# Patient Record
Sex: Female | Born: 1970 | Hispanic: Yes | Marital: Married | Smoking: Never smoker
Health system: Southern US, Community
[De-identification: ages and names within clinical notes are randomized; demographics above are authoritative.]

## PROBLEM LIST (undated history)

## (undated) DIAGNOSIS — Z8719 Personal history of other diseases of the digestive system: Secondary | ICD-10-CM

## (undated) HISTORY — DX: Personal history of other diseases of the digestive system: Z87.19

---

## 2007-09-15 HISTORY — PX: MYOMECTOMY: SHX85

## 2008-09-14 HISTORY — PX: CHOLECYSTECTOMY: SHX55

## 2011-10-23 ENCOUNTER — Emergency Department (HOSPITAL_COMMUNITY): Payer: Self-pay

## 2011-10-23 ENCOUNTER — Emergency Department (HOSPITAL_COMMUNITY)
Admission: EM | Admit: 2011-10-23 | Discharge: 2011-10-23 | Disposition: A | Discharge status: Home / Self Care | Payer: Self-pay | Attending: Emergency Medicine | Admitting: Emergency Medicine

## 2011-10-23 ENCOUNTER — Encounter (HOSPITAL_COMMUNITY): Payer: Self-pay

## 2011-10-23 DIAGNOSIS — Z79899 Other long term (current) drug therapy: Secondary | ICD-10-CM | POA: Insufficient documentation

## 2011-10-23 DIAGNOSIS — K8050 Calculus of bile duct without cholangitis or cholecystitis without obstruction: Secondary | ICD-10-CM | POA: Insufficient documentation

## 2011-10-23 DIAGNOSIS — R10816 Epigastric abdominal tenderness: Secondary | ICD-10-CM | POA: Insufficient documentation

## 2011-10-23 DIAGNOSIS — R197 Diarrhea, unspecified: Secondary | ICD-10-CM | POA: Insufficient documentation

## 2011-10-23 DIAGNOSIS — K859 Acute pancreatitis without necrosis or infection, unspecified: Secondary | ICD-10-CM | POA: Insufficient documentation

## 2011-10-23 DIAGNOSIS — R1013 Epigastric pain: Secondary | ICD-10-CM | POA: Insufficient documentation

## 2011-10-23 DIAGNOSIS — R110 Nausea: Secondary | ICD-10-CM | POA: Insufficient documentation

## 2011-10-23 DIAGNOSIS — K851 Biliary acute pancreatitis without necrosis or infection: Secondary | ICD-10-CM

## 2011-10-23 LAB — COMPREHENSIVE METABOLIC PANEL
ALT: 146 U/L — ABNORMAL HIGH (ref 0–35)
AST: 136 U/L — ABNORMAL HIGH (ref 0–37)
Alkaline Phosphatase: 195 U/L — ABNORMAL HIGH (ref 39–117)
CO2: 24 mEq/L (ref 19–32)
Calcium: 8.8 mg/dL (ref 8.4–10.5)
Potassium: 3.5 mEq/L (ref 3.5–5.1)
Sodium: 138 mEq/L (ref 135–145)
Total Protein: 7 g/dL (ref 6.0–8.3)

## 2011-10-23 LAB — CBC
HCT: 39.2 % (ref 36.0–46.0)
Hemoglobin: 13 g/dL (ref 12.0–15.0)
MCH: 27.8 pg (ref 26.0–34.0)
MCHC: 33.2 g/dL (ref 30.0–36.0)
MCV: 83.8 fL (ref 78.0–100.0)
RBC: 4.68 MIL/uL (ref 3.87–5.11)

## 2011-10-23 MED ORDER — MORPHINE SULFATE 2 MG/ML IJ SOLN
2.0000 mg | Freq: Once | INTRAMUSCULAR | Status: AC
  Administered 2011-10-23: 2 mg via INTRAVENOUS
  Filled 2011-10-23: qty 1

## 2011-10-23 MED ORDER — OXYCODONE HCL 5 MG PO TABS: 5.0000 mg | ORAL_TABLET | ORAL | Status: AC | PRN

## 2011-10-23 MED ORDER — FAMOTIDINE 20 MG PO TABS
20.0000 mg | ORAL_TABLET | Freq: Once | ORAL | Status: AC
  Administered 2011-10-23: 20 mg via ORAL
  Filled 2011-10-23: qty 1

## 2011-10-23 MED ORDER — GI COCKTAIL ~~LOC~~
30.0000 mL | Freq: Once | ORAL | Status: AC
  Administered 2011-10-23: 30 mL via ORAL
  Filled 2011-10-23: qty 30

## 2011-10-23 MED ORDER — ONDANSETRON HCL 4 MG PO TABS: 4.0000 mg | ORAL_TABLET | Freq: Four times a day (QID) | ORAL | Status: AC

## 2011-10-23 NOTE — ED Notes (Signed)
Pt waiting for a disposition.   

## 2011-10-23 NOTE — ED Provider Notes (Signed)
History     CSN: [id number]  Arrival date & time 10/23/11  1032   First MD Initiated Contact with Patient 10/23/11 1104      Chief Complaint  Patient presents with  . Abdominal Pain    (Consider location/radiation/quality/duration/timing/severity/associated sxs/prior treatment) The history is provided by the patient.  Pt has had several days of burning epigastric pain with radiation up to into low chest. Pain is associated with food. +nausea but no vomiting. Has had several episodes of diarrhea. No fever and chills. No blood in stool. Pt has had GB removed  History reviewed. No pertinent past medical history.  Past Surgical History  Procedure Date  . Cholecystectomy   . Cesarean section     march 2012    No family history on file.  History  Substance Use Topics  . Smoking status: Never Smoker   . Smokeless tobacco: Not on file  . Alcohol Use: Yes    OB History    Grav Para Term Preterm Abortions TAB SAB Ect Mult Living                  Review of Systems  Constitutional: Negative for fever and chills.  Respiratory: Negative for shortness of breath.   Cardiovascular: Negative for chest pain.  Gastrointestinal: Positive for nausea, abdominal pain and diarrhea. Negative for vomiting, constipation, blood in stool and abdominal distention.  Genitourinary: Negative for dysuria and flank pain.  Skin: Negative for color change.  Neurological: Negative for dizziness, weakness and light-headedness.    Allergies  Review of patient's allergies indicates no known allergies.  Home Medications   Current Outpatient Rx  Name Route Sig Dispense Refill  . LOPERAMIDE HCL 2 MG PO CAPS Oral Take 2 mg by mouth 4 (four) times daily as needed. Diarrhea.    . NORETHINDRONE 0.35 MG PO TABS Oral Take 1 tablet by mouth daily.    Marland Kitchen ONDANSETRON HCL 4 MG PO TABS Oral Take 1 tablet (4 mg total) by mouth every 6 (six) hours. 12 tablet 0  . OXYCODONE HCL 5 MG PO TABS Oral Take 1 tablet (5 mg  total) by mouth every 4 (four) hours as needed for pain. 15 tablet 0    BP 119/73  Pulse 101  Temp(Src) 98.4 F (36.9 C) (Oral)  Resp 20  Ht 4\' 9"  (1.448 m)  Wt 127 lb (57.607 kg)  BMI 27.48 kg/m2  SpO2 98%  Physical Exam  Nursing note and vitals reviewed. Constitutional: She is oriented to person, place, and time. She appears well-developed and well-nourished. No distress.  HENT:  Head: Normocephalic and atraumatic.  Mouth/Throat: Oropharynx is clear and moist.  Eyes: EOM are normal. Pupils are equal, round, and reactive to light.  Neck: Normal range of motion. Neck supple.  Cardiovascular: Normal rate and regular rhythm.   Pulmonary/Chest: Effort normal and breath sounds normal. No respiratory distress. She has no wheezes. She has no rales.  Abdominal: Soft. Bowel sounds are normal. She exhibits no mass. There is tenderness (Pt has ttp over epigastrum). There is no rebound and no guarding.  Musculoskeletal: Normal range of motion. She exhibits no edema and no tenderness.  Neurological: She is alert and oriented to person, place, and time.  Skin: Skin is warm and dry. No rash noted. No erythema.  Psychiatric: She has a normal mood and affect. Her behavior is normal.    ED Course  Procedures (including critical care time)  Labs Reviewed  COMPREHENSIVE METABOLIC PANEL - Abnormal;  Notable for the following:    Glucose, Bld 63 (*)    Creatinine, Ser 0.47 (*)    AST 136 (*)    ALT 146 (*)    Alkaline Phosphatase 195 (*)    All other components within normal limits  LIPASE, BLOOD - Abnormal; Notable for the following:    Lipase 200 (*)    All other components within normal limits  CBC  HCG, SERUM, QUALITATIVE   US Abdomen Complete  10/23/2011  *RADIOLOGY REPORT*  Clinical Data:  Elevated liver function studies.  COMPLETE ABDOMINAL ULTRASOUND  Comparison:  None  Findings:  Gallbladder:  Surgically absent.  Common bile duct:  The common bile duct is normal in caliber there are  small echogenic foci suspicious for common bile duct stones.  Liver:  The liver is sonographically unremarkable.  There is normal echogenicity without focal lesions or intrahepatic biliary dilatation.  IVC:  Normal caliber.  Pancreas:  Sonographically normal.  Spleen:  Normal size and echogenicity without focal lesions.  Right Kidney:  10.0 cm in length. Normal renal cortical thickness and echogenicity without focal lesions or hydronephrosis.  Left Kidney:  10.7 cm in length. Normal renal cortical thickness and echogenicity without focal lesions or hydronephrosis.  Abdominal aorta:  Normal caliber.  IMPRESSION:  1.  Status post cholecystectomy.  No biliary dilatation but there are small echogenic foci in the common bile duct suspicious for common bile duct stones. 2.  Unremarkable sonographic appearance of the liver, spleen, pancreas and both kidneys.  Original Report Authenticated By: P. Loralie Champagne, M.D.     1. Gallstone pancreatitis       MDM    Pt now pain-free. VS remain stable.   Dr Lindie Spruce returned call but deferred to GI. Discussed with Dr Rhea Belton of [facility]. Given option to admit or d/c home. With pt symptoms resolved, will d/c home on clear liquid diet and f/u with GI for possible outpatient ERCP. Pt is comfortable with plan and has been given strict instructions to return for fever, uncontrolled vomiting or worsening pain. Pt voiced understanding.  Loren Racer, MD 10/23/11 [phone]

## 2011-10-23 NOTE — ED Notes (Signed)
The pt has no complaints at present.  Alert oriented skin warm and dry

## 2011-10-23 NOTE — ED Notes (Signed)
Abdominal pain , n/v/d  since Saturday,  Pt. Reports her family has a stomach virus.  Pain is located in the top of abdomen.  Lambert Mody

## 2011-10-27 ENCOUNTER — Encounter: Payer: Self-pay | Admitting: Internal Medicine

## 2011-10-28 ENCOUNTER — Other Ambulatory Visit: Payer: Self-pay | Admitting: *Deleted

## 2011-10-28 DIAGNOSIS — K8050 Calculus of bile duct without cholangitis or cholecystitis without obstruction: Secondary | ICD-10-CM

## 2011-11-02 ENCOUNTER — Encounter (HOSPITAL_COMMUNITY): Payer: Self-pay | Admitting: *Deleted

## 2011-11-03 ENCOUNTER — Other Ambulatory Visit: Payer: Self-pay | Admitting: Gastroenterology

## 2011-11-03 ENCOUNTER — Encounter (HOSPITAL_COMMUNITY): Payer: Self-pay | Admitting: *Deleted

## 2011-11-03 ENCOUNTER — Encounter (HOSPITAL_COMMUNITY)
Admission: RE | Disposition: A | Discharge status: Home Health | Payer: Self-pay | Source: Ambulatory Visit | Attending: Gastroenterology

## 2011-11-03 ENCOUNTER — Ambulatory Visit (HOSPITAL_COMMUNITY)
Admission: RE | Admit: 2011-11-03 | Discharge: 2011-11-03 | Disposition: A | Discharge status: Home Health | Payer: Self-pay | Source: Ambulatory Visit | Attending: Gastroenterology | Admitting: Gastroenterology

## 2011-11-03 ENCOUNTER — Ambulatory Visit (HOSPITAL_COMMUNITY): Payer: Self-pay | Admitting: Anesthesiology

## 2011-11-03 ENCOUNTER — Ambulatory Visit (HOSPITAL_COMMUNITY): Payer: Self-pay

## 2011-11-03 ENCOUNTER — Encounter (HOSPITAL_COMMUNITY): Payer: Self-pay | Admitting: Anesthesiology

## 2011-11-03 DIAGNOSIS — K8050 Calculus of bile duct without cholangitis or cholecystitis without obstruction: Secondary | ICD-10-CM

## 2011-11-03 DIAGNOSIS — R932 Abnormal findings on diagnostic imaging of liver and biliary tract: Secondary | ICD-10-CM

## 2011-11-03 DIAGNOSIS — R109 Unspecified abdominal pain: Secondary | ICD-10-CM | POA: Insufficient documentation

## 2011-11-03 HISTORY — PX: ERCP: SHX5425

## 2011-11-03 SURGERY — ERCP, WITH INTERVENTION IF INDICATED
Anesthesia: General

## 2011-11-03 MED ORDER — OXYCODONE HCL 5 MG PO TABS: 5.0000 mg | ORAL_TABLET | ORAL | Status: AC | PRN

## 2011-11-03 MED ORDER — SUCCINYLCHOLINE CHLORIDE 20 MG/ML IJ SOLN
INTRAMUSCULAR | Status: DC | PRN
  Administered 2011-11-03: 100 mg via INTRAVENOUS

## 2011-11-03 MED ORDER — CIPROFLOXACIN IN D5W 400 MG/200ML IV SOLN: 400.0000 mg | Freq: Once | INTRAVENOUS | Status: DC

## 2011-11-03 MED ORDER — MIDAZOLAM HCL 5 MG/5ML IJ SOLN
INTRAMUSCULAR | Status: DC | PRN
  Administered 2011-11-03 (×2): 2 mg via INTRAVENOUS

## 2011-11-03 MED ORDER — LACTATED RINGERS IV SOLN: INTRAVENOUS | Status: DC

## 2011-11-03 MED ORDER — LACTATED RINGERS IV SOLN
INTRAVENOUS | Status: DC
  Administered 2011-11-03: 12:00:00 via INTRAVENOUS
  Administered 2011-11-03: 1000 mL via INTRAVENOUS

## 2011-11-03 MED ORDER — PROMETHAZINE HCL 25 MG/ML IJ SOLN: 6.2500 mg | INTRAMUSCULAR | Status: DC | PRN

## 2011-11-03 MED ORDER — CIPROFLOXACIN IN D5W 400 MG/200ML IV SOLN
400.0000 mg | Freq: Two times a day (BID) | INTRAVENOUS | Status: DC
  Administered 2011-11-03: 400 mg via INTRAVENOUS

## 2011-11-03 MED ORDER — CIPROFLOXACIN IN D5W 400 MG/200ML IV SOLN
INTRAVENOUS | Status: AC
  Filled 2011-11-03: qty 200

## 2011-11-03 MED ORDER — PROPOFOL 10 MG/ML IV EMUL
INTRAVENOUS | Status: DC | PRN
  Administered 2011-11-03: 200 mg via INTRAVENOUS

## 2011-11-03 MED ORDER — SODIUM CHLORIDE 0.9 % IV SOLN
INTRAVENOUS | Status: DC | PRN
  Administered 2011-11-03: 13:00:00

## 2011-11-03 MED ORDER — ONDANSETRON HCL 4 MG/2ML IJ SOLN
INTRAMUSCULAR | Status: DC | PRN
  Administered 2011-11-03: 4 mg via INTRAVENOUS

## 2011-11-03 MED ORDER — FENTANYL CITRATE 0.05 MG/ML IJ SOLN: 25.0000 ug | INTRAMUSCULAR | Status: DC | PRN

## 2011-11-03 MED ORDER — FENTANYL CITRATE 0.05 MG/ML IJ SOLN
INTRAMUSCULAR | Status: DC | PRN
  Administered 2011-11-03 (×2): 50 ug via INTRAVENOUS

## 2011-11-03 NOTE — H&P (Signed)
  History of Present Illness: Cassandra Lyons is a 41 year old Hispanic female status post cholecystectomy here for ERCP. Since her surgery 3 years ago she's had several episodes of severe upper abdominal pain. Workup until recently was negative. She was seen in the ER approximately a week and half ago an ultrasound, which I reviewed, demonstrated a possible common bile duct stone. LFT abnormality seen as well. She's had no pain since her last visit.    Past Medical History  Diagnosis Date  . No pertinent past medical history    Past Surgical History  Procedure Date  . Cesarean section     march 2012  . Myomectomy 2009  . Cholecystectomy 2010   family history is not on file. Current Facility-Administered Medications  Medication Dose Route Frequency Provider Last Rate Last Dose  . fentaNYL (SUBLIMAZE) injection 25-50 mcg  25-50 mcg Intravenous Q5 min PRN Gaetano Hawthorne, MD      . lactated ringers infusion   Intravenous Continuous Gaetano Hawthorne, MD 125 mL/hr at 11/03/11 0925 1,000 mL at 11/03/11 0925  . lactated ringers infusion   Intravenous Continuous Louis Meckel, MD      . promethazine [facility]) injection 6.25-12.5 mg  6.25-12.5 mg Intravenous Q15 min PRN Gaetano Hawthorne, MD       Allergies as of 10/28/2011  . (No Known Allergies)    reports that she has never smoked. She does not have any smokeless tobacco history on file. She reports that she does not drink alcohol or use illicit drugs.     Review of Systems: Pertinent positive and negative review of systems were noted in the above HPI section. All other review of systems were otherwise negative.  Vital signs were reviewed in today's medical record Physical Exam: General: Well developed , well nourished, no acute distress Head: Normocephalic and atraumatic Eyes:  sclerae anicteric, EOMI Ears: Normal auditory acuity Mouth: No deformity or lesions Neck: Supple, no masses or thyromegaly Lungs: Clear throughout to  auscultation Heart: Regular rate and rhythm; no murmurs, rubs or bruits Abdomen: Soft, non tender and non distended. No masses, hepatosplenomegaly or hernias noted. Normal Bowel sounds Rectal:deferred Musculoskeletal: Symmetrical with no gross deformities  Skin: No lesions on visible extremities Pulses:  Normal pulses noted Extremities: No clubbing, cyanosis, edema or deformities noted Neurological: Alert oriented x 4, grossly nonfocal Cervical Nodes:  No significant cervical adenopathy Inguinal Nodes: No significant inguinal adenopathy Psychological:  Alert and cooperative. Normal mood and affect  Impression-recurrent episodes of abdominal pain with recent LFT abnormalities and ultrasound suggesting a distal common bile duct stone.  Recommendation-ERCP

## 2011-11-03 NOTE — Op Note (Signed)
[facility] [address] [place], [place]  [address]  ERCP PROCEDURE REPORT  PATIENT:  Cassandra, Lyons  MR#:  [id number] BIRTHDATE:  1971-08-21  GENDER:  female  ENDOSCOPIST:  Barbette Hair. Arlyce Dice, MD ASSISTANT:  Bary Leriche, Loleta Rose. Domingo Mend, RN  PROCEDURE DATE:  11/03/2011 PROCEDURE:  ERCP with removal of stones, ERCP with sphincterotomy ASA CLASS:  Class I  INDICATIONS:  suspected stone  MEDICATIONS:   MAC sedation, administered by CRNA TOPICAL ANESTHETIC:  DESCRIPTION OF PROCEDURE:   After the risks benefits and alternatives of the procedure were thoroughly explained, informed consent was obtained.  The Pentax ERCP [id number] endoscope was introduced through the mouth and advanced to the third portion of the duodenum.  The pancreatic duct was successfully cannulated and filled with contrast. Care was taken not to overfill the duct. The pancreatic duct was selective cannulated and was normal throughout.  The common bile duct was selectively cannulated. No gross filling defects were seen. A 12 mm sphincterotomy was made. There was no bleeding. The bile duct was swept with a 12 mm balloon stone extractor on multiple occasions. Very small stone fragments were extracted from the duct (see image4).    The scope was then completely withdrawn from the patient and the procedure terminated. <<PROCEDUREIMAGES>>  COMPLICATIONS:  None  ENDOSCOPIC IMPRESSION: Small bile duct stones-status post sphincterotomy and stone extraction  RECOMMENDATIONS: Followup LFTs in 2 weeks Office visit in 2 weeks  ______________________________ Barbette Hair. Arlyce Dice, MD  CC:  n. eSIGNED:   Barbette Hair. Kaplan at 11/03/2011 01:05 PM  Noreene Filbert, [id number]

## 2011-11-03 NOTE — Transfer of Care (Signed)
Immediate Anesthesia Transfer of Care Note  Patient: Cassandra Lyons  Procedure(s) Performed: Procedure(s) (LRB): ENDOSCOPIC RETROGRADE CHOLANGIOPANCREATOGRAPHY (ERCP) (N/A)  Patient Location: PACU  Anesthesia Type: General  Level of Consciousness: awake, alert , oriented, patient cooperative and responds to stimulation  Airway & Oxygen Therapy: Patient Spontanous Breathing and Patient connected to face mask oxygen  Post-op Assessment: Report given to PACU RN, Post -op Vital signs reviewed and stable and Patient moving all extremities  Post vital signs: stable  Complications: No apparent anesthesia complications

## 2011-11-03 NOTE — Anesthesia Preprocedure Evaluation (Signed)
Anesthesia Evaluation  Patient identified by MRN, date of birth, ID band Patient awake    Reviewed: Allergy & Precautions, H&P , NPO status , Patient's Chart, lab work & pertinent test results  Airway Mallampati: II TM Distance: >3 FB Neck ROM: full    Dental No notable dental hx.    Pulmonary neg pulmonary ROS,  clear to auscultation  Pulmonary exam normal       Cardiovascular Exercise Tolerance: Good neg cardio ROS regular Normal    Neuro/Psych Negative Neurological ROS  Negative Psych ROS   GI/Hepatic negative GI ROS, Neg liver ROS,   Endo/Other  Negative Endocrine ROS  Renal/GU negative Renal ROS  Genitourinary negative   Musculoskeletal   Abdominal   Peds  Hematology negative hematology ROS (+)   Anesthesia Other Findings   Reproductive/Obstetrics negative OB ROS                           Anesthesia Physical Anesthesia Plan  ASA: I  Anesthesia Plan: General   Post-op Pain Management:    Induction: Intravenous  Airway Management Planned: Oral ETT  Additional Equipment:   Intra-op Plan:   Post-operative Plan: Extubation in OR  Informed Consent: I have reviewed the patients History and Physical, chart, labs and discussed the procedure including the risks, benefits and alternatives for the proposed anesthesia with the patient or authorized representative who has indicated his/her understanding and acceptance.   Dental Advisory Given  Plan Discussed with: CRNA and Surgeon  Anesthesia Plan Comments:         Anesthesia Quick Evaluation

## 2011-11-03 NOTE — Anesthesia Postprocedure Evaluation (Signed)
  Anesthesia Post-op Note  Patient: Cassandra Lyons  Procedure(s) Performed: Procedure(s) (LRB): ENDOSCOPIC RETROGRADE CHOLANGIOPANCREATOGRAPHY (ERCP) (N/A)  Patient Location: PACU  Anesthesia Type: General  Level of Consciousness: awake and alert   Airway and Oxygen Therapy: Patient Spontanous Breathing  Post-op Pain: mild  Post-op Assessment: Post-op Vital signs reviewed, Patient's Cardiovascular Status Stable, Respiratory Function Stable, Patent Airway and No signs of Nausea or vomiting  Post-op Vital Signs: stable  Complications: No apparent anesthesia complications

## 2011-11-03 NOTE — Progress Notes (Signed)
Patient in prone position pressure point pads applied to extremities ,chest rolls in place, face cradle in place .

## 2011-11-03 NOTE — Discharge Instructions (Addendum)
Colangiopancreatografa retrgrada endoscpica (Endoscopic Retrograde Cholangiopancreatography) Esta prueba se realiza para evaluar a las personas con ictericia (un trastorno en el que la piel se vuelve de color amarillo debido al elevado nivel de bilirrubina en la [place]). La bilirrubina es un producto de la sangre que aumenta su nivel cuando se produce una obstruccin en el conducto biliar. Los conductos biliares se asemejan a redes pluviales que transportan la bilis desde el hgado hasta la vescula biliar y de all al intestino delgado. En esta prueba, se inserta un endoscopio de fibra ptica (similar a un pequeo telescopio del tamao de un lpiz. Esta prueba revela la presencia de clculos, estrechamientos, quistes, tumores y otras irregularidades dentro de los conductos pancreticos y biliares. PREPARACIN PARA LA PRUEBA No coma ni beba despus de la medianoche anterior al procedimiento.  HALLAZGOS NORMALES Tamao normal de los conductos biliares y pancreticos. Ausencia de obstrucciones o defectos de llenado dentro de los conductos pancreticos y biliares. La denominacin de valores de laboratorio "Normales" puede variar entre los diferentes laboratorios y hospitales. Controle siempre con su mdico antes de Leisure centre manager de laboratorio para Metallurgist significado de los resultados y si los valores se consideran "dentro de los lmites normales." [employer] DE LA PRUEBA El mdico leer los [place] y Agricultural engineer con usted la importancia y el significado de los [place], as como las opciones de tratamiento y la necesidad de pruebas adicionales, si fuera necesario. OBTENCIN DE LOS RESULTADOS DE LAS PRUEBAS Es su responsabilidad retirar el resultado del [place]. Consulte en el laboratorio cuando y cmo podr [employer]. Document Released: 01/17/2009 Document Revised: 05/13/2011 [facility] Patient Information 2012 [place], [place].Esophagogastroduodenoscopy This is an endoscopic  procedure (a procedure that uses a device like a flexible telescope) that allows your caregiver to view the upper stomach and small bowel. This test allows your caregiver to look at the esophagus. The esophagus carries food from your mouth to your stomach. They can also look at your duodenum. This is the first part of the small intestine that attaches to the stomach. This test is used to detect problems in the bowel such as ulcers and inflammation. PREPARATION FOR TEST Common Bile Duct Stones The gallbladder is located in the right side of the upper belly (abdomen) under the liver. It stores bile from the liver until it is needed. Bile is made up of cholesterol, water, salts, fats, proteins, and waste product (bilirubin). The common bile duct is the tube that carries bile from other ducts to the small intestine to help digest fats. Bile duct stones are stone-like pieces of material that form in the gallbladder and become lodged in the common bile duct. Stones may be present in the gallbladder as well as the bile duct. They can vary in size and number. Having a stone in the common bile duct is a serious problem. The stone may pass on its own. However, if it gets stuck and causes a blockage in the common bile duct, yellow color in the skin or eyes (jaundice) develops. If the blockage persists, the bile becomes infected (cholangitis). This is a life-threatening problem. CAUSES  Excess cholesterol in the bile is the most common reason bile hardens into a stone-like material. Excess bilirubin or not enough bile salts may also cause the bile to harden. Why this happens is not entirely clear. Certain factors increase the risk of developing stones, such as:  Being female. Women are twice as likely to develop gallstones, especially those who are pregnant, taking hormone replacement therapy,  or taking birth control pills.   Having a family history of stones.   Being overweight (even moderately overweight).    Consuming a high-fat, high-cholesterol diet.   Crash dieting or rapid weight loss.   Being older than 60.   Having [employer] [place] or [place] [employer].   Taking cholesterol-lowering medicine.   Having diabetes.  SYMPTOMS  Small bile duct stones may not cause any symptoms. They are sometimes discovered while having tests for other conditions. Larger stones can block the flow of bile into the small intestine. This can cause infection and inflammation in the gallbladder, ducts, or sometimes, the liver or pancreas. Symptoms (sometimes called a "gallbladder attack") may include:  Pain in the upper right abdomen (biliary colic). The pain can last from 30 minutes to several hours.   Pain between the shoulder blades at the back.   Pain under the right shoulder.   Nausea.   Vomiting.   Fever.   Jaundice.  DIAGNOSIS  Symptoms can mimic other conditions, so proper diagnosis is important. An imaging test is often performed to confirm the diagnosis and determine the location and size of the stones, such as:  Ultrasound.   CT scan.   Cholescintigraphy, or HIDA (hepatobiliary iminodiacetic acid) scan. This is an imaging technique using a safe, radioactive dye.   ERCP (endoscopic retrograde cholangiopancreatography). This is an imaging technique that can detect and remove stones.  Blood tests may also be performed. TREATMENT  Watchful waiting may be all that is necessary if you do not have symptoms. If you are experiencing pain or other symptoms:  Pain medicine and antibiotics may be given.   ERCP may be performed to locate and remove the stones to relieve the obstruction. In this procedure, a tube is threaded through the mouth, down into the small intestine. After having an ERCP, the gallbladder will often be removed (cholecystectomy) during the same hospitalization. Cholecystectomy is typically performed with a flexible telescope-like instrument (laparoscope) through tiny  incisions.   Rarely, some cases may require open surgery, with a larger incision. Open surgery is needed when ERCP is unsuccessful to remove the stone or if laparoscopic surgery is unsuccessful to remove the gallbladder.   Insertion of a bile drainage tube (cholecystostomy) may be performed to take care of common bile duct stones. In this procedure, a tube is inserted into the gallbladder or the bile ducts in the liver to relieve the pressure caused by the stone. After the patient gets better, the gallbladder will be removed and the stone dealt with.  PREVENTION   Limit your intake of fats and cholesterol.   Avoid "crash" diets or rapid weight loss.   Maintain a healthy weight.  HOME CARE INSTRUCTIONS   Take pain relievers as directed.   Limit your intake of fats and cholesterol if you experience pain or bloating. Try eating smaller meals.   Follow up with your caregiver for additional exams and testing as directed.   If you have had a procedure or surgery, follow your caregiver's specific instructions for care after surgery.  SEEK MEDICAL CARE IF:  Attacks become more frequent and impact your daily activities. SEEK IMMEDIATE MEDICAL CARE IF:   Your pain does not go away or becomes severe (lasts up to 5 hours).   You have a fever.   You feel nauseous.   You are vomiting.   You appear to have jaundice.   Your stools are light reddish-brown in color.  MAKE SURE YOU:   Understand  these instructions.   Will watch your condition.   Will get help right away if you are not doing well or get worse.  Document Released: 11/25/2009 Document Revised: 05/13/2011 Document Reviewed: 11/25/2009 [facility] Patient Information 2012 [place], [place].

## 2011-11-04 ENCOUNTER — Encounter (HOSPITAL_COMMUNITY): Payer: Self-pay | Admitting: Gastroenterology

## 2011-11-04 ENCOUNTER — Other Ambulatory Visit: Payer: Self-pay | Admitting: Gastroenterology

## 2011-11-04 DIAGNOSIS — Z9889 Other specified postprocedural states: Secondary | ICD-10-CM

## 2011-11-06 ENCOUNTER — Ambulatory Visit: Payer: Self-pay | Admitting: Internal Medicine

## 2011-11-26 ENCOUNTER — Ambulatory Visit (INDEPENDENT_AMBULATORY_CARE_PROVIDER_SITE_OTHER): Payer: Self-pay | Admitting: Gastroenterology

## 2011-11-26 ENCOUNTER — Other Ambulatory Visit: Payer: Self-pay

## 2011-11-26 ENCOUNTER — Encounter: Payer: Self-pay | Admitting: Gastroenterology

## 2011-11-26 VITALS — BP 102/68 | HR 88 | Ht <= 58 in | Wt 117.2 lb

## 2011-11-26 DIAGNOSIS — K8050 Calculus of bile duct without cholangitis or cholecystitis without obstruction: Secondary | ICD-10-CM

## 2011-11-26 LAB — HEPATIC FUNCTION PANEL
ALT: 53 U/L — ABNORMAL HIGH (ref 0–35)
AST: 19 U/L (ref 0–37)
Alkaline Phosphatase: 87 U/L (ref 39–117)
Bilirubin, Direct: 0.1 mg/dL (ref 0.0–0.3)
Total Protein: 7.4 g/dL (ref 6.0–8.3)

## 2011-11-26 NOTE — Patient Instructions (Signed)
You will go to the basement today for labs Follow up as needed

## 2011-11-26 NOTE — Progress Notes (Signed)
History of Present Illness: Cassandra Lyons is a 41 year old Hispanic female seen in the hospital in February, 2013 for abdominal pain and abnormal liver tests.  She underwent an ERCP that demonstrated a dilated common bile duct. Few stone fragments were removed following sphincterotomy. It was felt that she probably passed stones. Since discharge she has felt well.    Past Medical History  Diagnosis Date  . History of gallstones   . History of pancreatitis    Past Surgical History  Procedure Date  . Cesarean section     march 2012  . Myomectomy 2009  . Cholecystectomy 2010  . Ercp 11/03/2011    Procedure: ENDOSCOPIC RETROGRADE CHOLANGIOPANCREATOGRAPHY (ERCP);  Surgeon: Louis Meckel, MD;  Location: [facility];  Service: Endoscopy;  Laterality: N/A;   family history is not on file. Current Outpatient Prescriptions  Medication Sig Dispense Refill  . norethindrone (HEATHER) 0.35 MG tablet Take 1 tablet by mouth daily.       Allergies as of 11/26/2011  . (No Known Allergies)    reports that she has never smoked. She has never used smokeless tobacco. She reports that she drinks alcohol. She reports that she does not use illicit drugs.     Review of Systems: Pertinent positive and negative review of systems were noted in the above HPI section. All other review of systems were otherwise negative.  Vital signs were reviewed in today's medical record Physical Exam: General: Well developed , well nourished, no acute distress Head: Normocephalic and atraumatic Eyes:  sclerae anicteric, EOMI Ears: Normal auditory acuity Mouth: No deformity or lesions Neck: Supple, no masses or thyromegaly Lungs: Clear throughout to auscultation Heart: Regular rate and rhythm; no murmurs, rubs or bruits Abdomen: Soft, non tender and non distended. No masses, hepatosplenomegaly or hernias noted. Normal Bowel sounds Rectal:deferred Musculoskeletal: Symmetrical with no gross deformities  Skin: No  lesions on visible extremities Pulses:  Normal pulses noted Extremities: No clubbing, cyanosis, edema or deformities noted Neurological: Alert oriented x 4, grossly nonfocal Cervical Nodes:  No significant cervical adenopathy Inguinal Nodes: No significant inguinal adenopathy Psychological:  Alert and cooperative. Normal mood and affect

## 2011-11-26 NOTE — Assessment & Plan Note (Addendum)
She  Is status post endoscopic sphincterotomy. She likely passed stones.  Medications #1 followup LFTs

## 2015-03-27 ENCOUNTER — Other Ambulatory Visit (HOSPITAL_COMMUNITY): Payer: Self-pay | Admitting: Urology

## 2015-03-27 DIAGNOSIS — Z1231 Encounter for screening mammogram for malignant neoplasm of breast: Secondary | ICD-10-CM

## 2015-04-10 ENCOUNTER — Ambulatory Visit (HOSPITAL_COMMUNITY)
Admission: RE | Admit: 2015-04-10 | Discharge: 2015-04-10 | Disposition: A | Discharge status: Home / Self Care | Payer: Self-pay | Source: Ambulatory Visit | Attending: Urology | Admitting: Urology

## 2015-04-10 DIAGNOSIS — Z1231 Encounter for screening mammogram for malignant neoplasm of breast: Secondary | ICD-10-CM

## 2016-04-27 ENCOUNTER — Other Ambulatory Visit: Payer: Self-pay | Admitting: Physician Assistant

## 2016-04-27 DIAGNOSIS — Z1231 Encounter for screening mammogram for malignant neoplasm of breast: Secondary | ICD-10-CM

## 2016-05-05 ENCOUNTER — Ambulatory Visit
Admission: RE | Admit: 2016-05-05 | Discharge: 2016-05-05 | Disposition: A | Discharge status: Home / Self Care | Payer: No Typology Code available for payment source | Source: Ambulatory Visit | Attending: Physician Assistant | Admitting: Physician Assistant

## 2016-05-05 DIAGNOSIS — Z1231 Encounter for screening mammogram for malignant neoplasm of breast: Secondary | ICD-10-CM

## 2017-07-01 ENCOUNTER — Other Ambulatory Visit: Payer: Self-pay | Admitting: Obstetrics & Gynecology

## 2017-07-01 DIAGNOSIS — Z1231 Encounter for screening mammogram for malignant neoplasm of breast: Secondary | ICD-10-CM

## 2017-07-28 ENCOUNTER — Ambulatory Visit
Admission: RE | Admit: 2017-07-28 | Discharge: 2017-07-28 | Disposition: A | Discharge status: Home / Self Care | Payer: No Typology Code available for payment source | Source: Ambulatory Visit | Attending: Obstetrics & Gynecology | Admitting: Obstetrics & Gynecology

## 2017-07-28 DIAGNOSIS — Z1231 Encounter for screening mammogram for malignant neoplasm of breast: Secondary | ICD-10-CM

## 2018-10-10 ENCOUNTER — Other Ambulatory Visit: Payer: Self-pay | Admitting: Obstetrics and Gynecology

## 2018-10-10 DIAGNOSIS — Z1231 Encounter for screening mammogram for malignant neoplasm of breast: Secondary | ICD-10-CM

## 2018-11-08 ENCOUNTER — Ambulatory Visit
Admission: RE | Admit: 2018-11-08 | Discharge: 2018-11-08 | Disposition: A | Discharge status: Home / Self Care | Payer: Self-pay | Source: Ambulatory Visit | Attending: Obstetrics and Gynecology | Admitting: Obstetrics and Gynecology

## 2018-11-08 DIAGNOSIS — Z1231 Encounter for screening mammogram for malignant neoplasm of breast: Secondary | ICD-10-CM

## 2019-12-22 ENCOUNTER — Other Ambulatory Visit: Payer: Self-pay

## 2019-12-22 DIAGNOSIS — Z1231 Encounter for screening mammogram for malignant neoplasm of breast: Secondary | ICD-10-CM

## 2020-01-23 ENCOUNTER — Other Ambulatory Visit: Payer: Self-pay

## 2020-01-23 ENCOUNTER — Ambulatory Visit
Admission: RE | Admit: 2020-01-23 | Discharge: 2020-01-23 | Disposition: A | Discharge status: Home / Self Care | Payer: Self-pay | Source: Ambulatory Visit | Attending: Obstetrics and Gynecology | Admitting: Obstetrics and Gynecology

## 2020-01-23 ENCOUNTER — Ambulatory Visit: Payer: Self-pay | Admitting: *Deleted

## 2020-01-23 VITALS — BP 120/74 | Temp 98.2°F | Wt 135.4 lb

## 2020-01-23 DIAGNOSIS — Z01419 Encounter for gynecological examination (general) (routine) without abnormal findings: Secondary | ICD-10-CM

## 2020-01-23 DIAGNOSIS — Z124 Encounter for screening for malignant neoplasm of cervix: Secondary | ICD-10-CM

## 2020-01-23 DIAGNOSIS — Z1231 Encounter for screening mammogram for malignant neoplasm of breast: Secondary | ICD-10-CM

## 2020-01-23 NOTE — Progress Notes (Signed)
Ms. Sathvika Ojo is a 49 y.o. No obstetric history on file. female who presents to [facility] clinic today with no complaints.    Pap Smear: Pap smear completed today. Last Pap smear was 3 years at [facility] and was normal per patient. Per patient has a history of an abnormal Pap smear 18 years ago that a repeat Pap smear was completed for follow-up. No Pap smear results are in Epic.   Physical exam: Breasts Breasts symmetrical. No skin abnormalities bilateral breasts. No nipple retraction bilateral breasts. No nipple discharge bilateral breasts. No lymphadenopathy. No lumps palpated bilateral breasts.       Pelvic/Bimanual Ext Genitalia No lesions, no swelling and no discharge observed on external genitalia.        Vagina Vagina pink and normal texture. No lesions or discharge observed in vagina.        Cervix Cervix is present. Cervix pink and of normal texture. Cervix friable. No discharge observed.    Uterus Uterus is present and palpable. Uterus in normal position and normal size.        Adnexae Bilateral ovaries present and palpable. No tenderness on palpation.         Rectovaginal No rectal exam completed today since patient had no rectal complaints. No skin abnormalities observed on exam.     Smoking History: Patient has never smoked.  Patient Navigation: Patient education provided. Access to services provided for patient through [facility] program.    Breast and Cervical Cancer Risk Assessment: Patient has no family history of breast cancer, known genetic mutations, or radiation treatment to the chest before age 30. Patient has no history of cervical dysplasia, immunocompromised, or DES exposure in-utero.  Risk Assessment    Risk Scores      01/23/2020   Last edited by: Narda Rutherford, LPN   5-year risk: 0.6 %   Lifetime risk: 6.7 %          A: [facility] exam with pap smear   P: Referred patient to the [facility] for a  screening mammogram. Appointment scheduled for Tuesday, Jan 23, 2020 at 1110.  Priscille Heidelberg, RN 01/23/2020 12:39 PM

## 2020-01-23 NOTE — Patient Instructions (Signed)
Explained breast self awareness with [name]. Let her know [facility] will cover Pap smears and HPV typing every 5 years unless has a history of abnormal Pap smears. Referred patient to the [facility] for a screening mammogram. Appointment scheduled for Tuesday, Jan 23, 2020 at 1110. Patient aware of appointment and will be there. Let patient know the Breast Center will follow up with her within the next couple weeks with results of her mammogram by letter or phone. Informed patient that will follow-up with her within the next couple of weeks with results of her Pap smear by letter or phone. Cassandra Lyons verbalized understanding.  Kilee Hedding, Kathaleen Maser, RN 12:39 PM

## 2020-01-24 LAB — CYTOLOGY - PAP
Comment: NEGATIVE
Diagnosis: NEGATIVE
High risk HPV: NEGATIVE

## 2020-02-02 ENCOUNTER — Telehealth: Payer: Self-pay

## 2020-02-02 NOTE — Telephone Encounter (Signed)
Via Delorise Royals, Spanish Interpreter, patient informed negative Pap/HPV results.

## 2020-05-10 ENCOUNTER — Other Ambulatory Visit: Payer: Self-pay

## 2020-05-10 DIAGNOSIS — Z20822 Contact with and (suspected) exposure to covid-19: Secondary | ICD-10-CM

## 2020-05-12 LAB — NOVEL CORONAVIRUS, NAA: SARS-CoV-2, NAA: DETECTED — AB

## 2020-05-12 LAB — SARS-COV-2, NAA 2 DAY TAT

## 2021-01-16 IMAGING — MG DIGITAL SCREENING BILAT W/ TOMO W/ CAD
8 series · 9 of 24 positions shown · non-contrast
Comparison: Previous exam(s).

CLINICAL DATA: Screening.

EXAM:
DIGITAL SCREENING BILATERAL MAMMOGRAM WITH TOMO AND CAD

[L CC synth-2D]
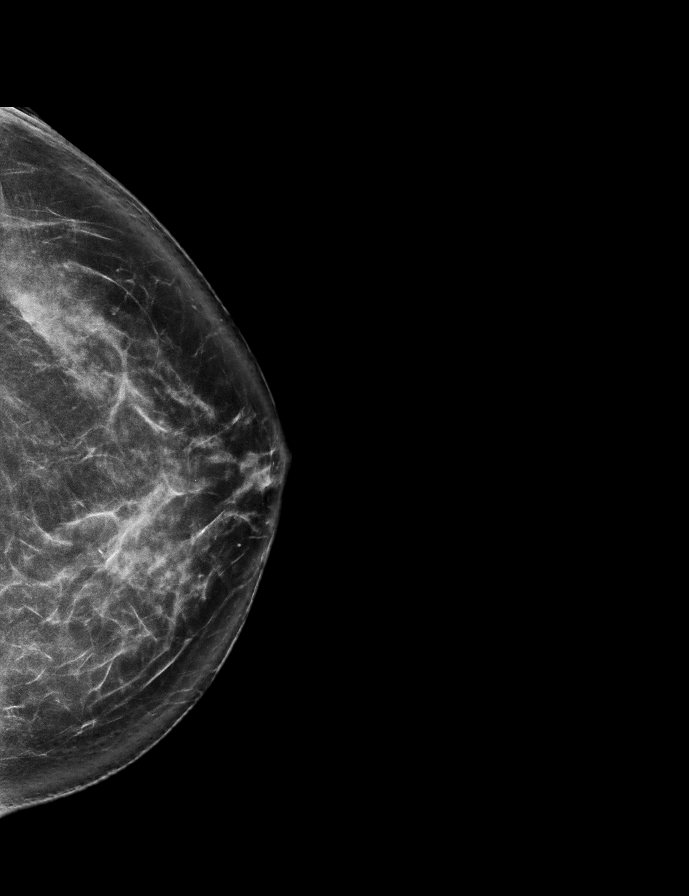

[L MLO synth-2D]
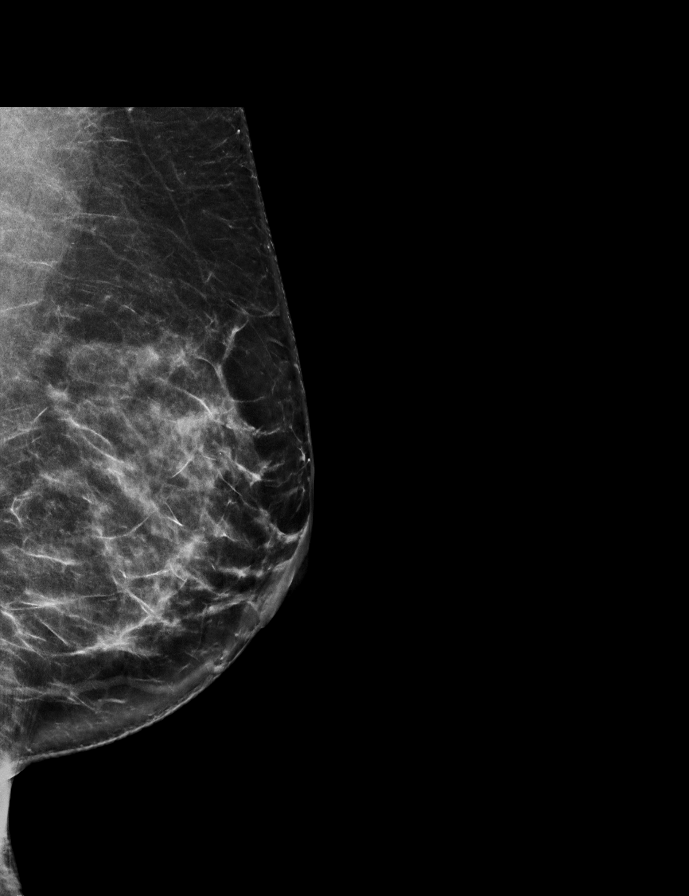

[R MLO synth-2D]
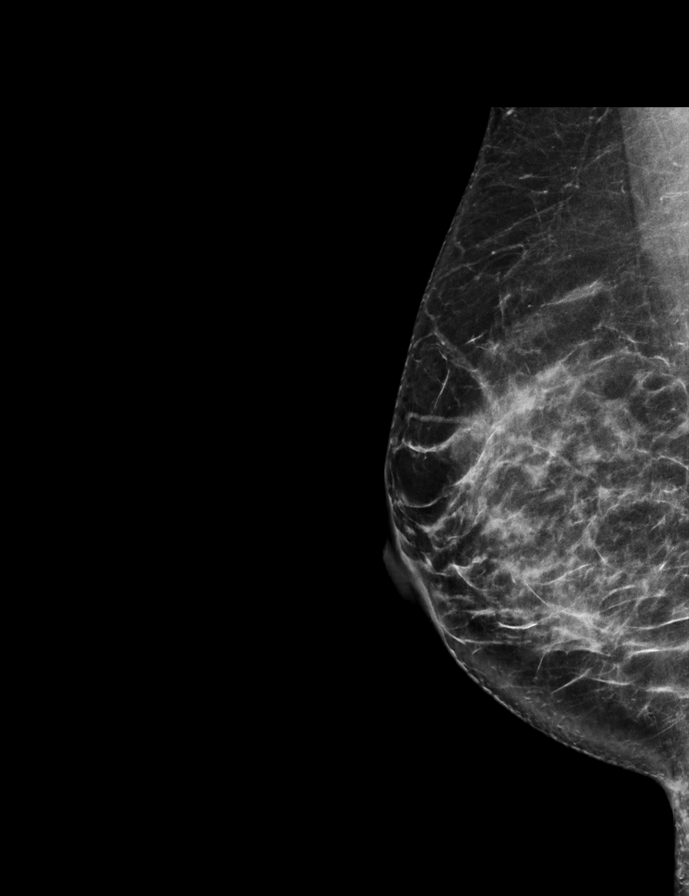

[R CC synth-2D]
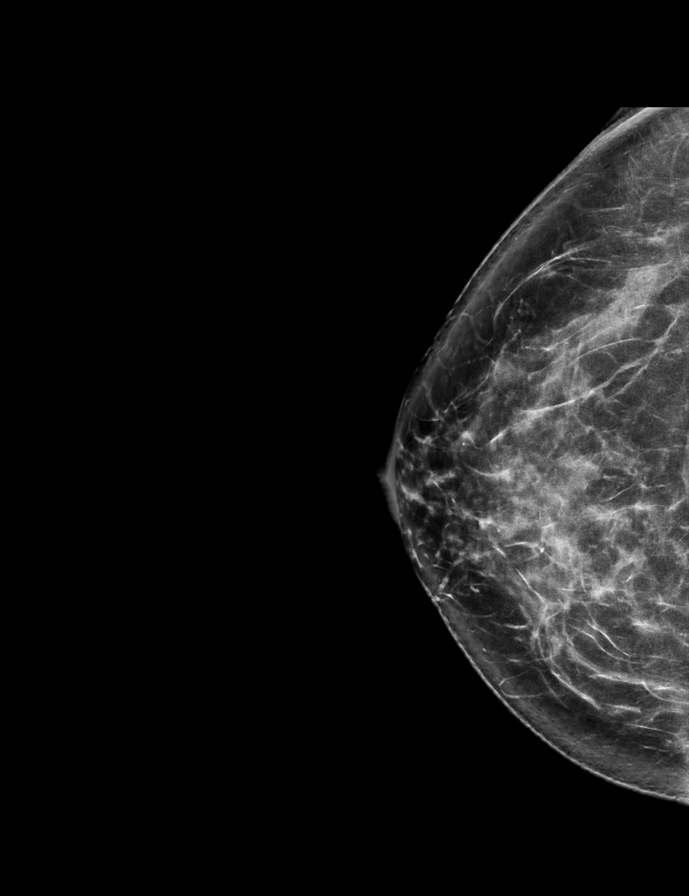

[R MLO tomo · 2 of 69 frames shown]
[frame 23/69]
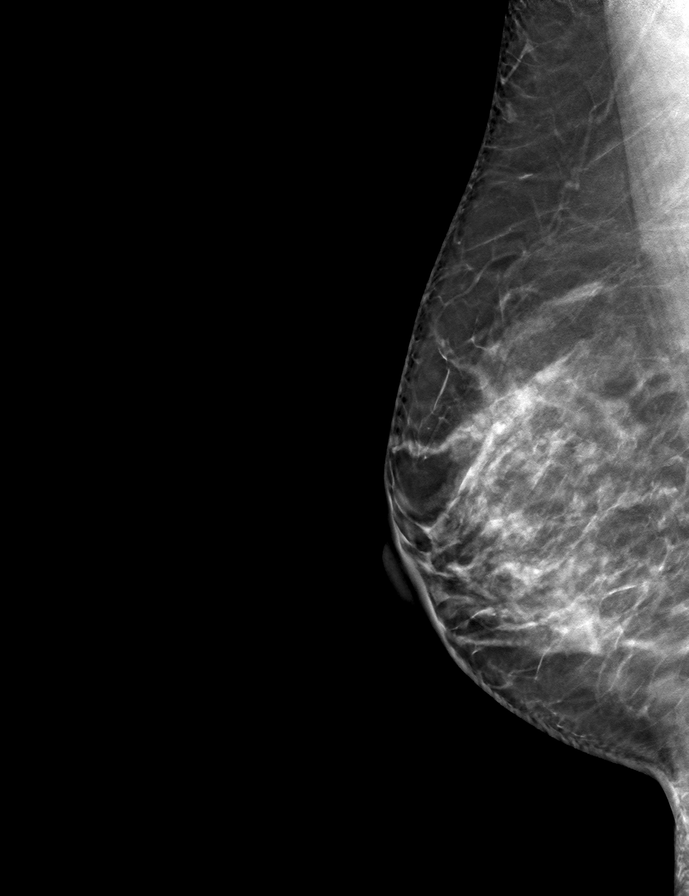
[frame 35/69]
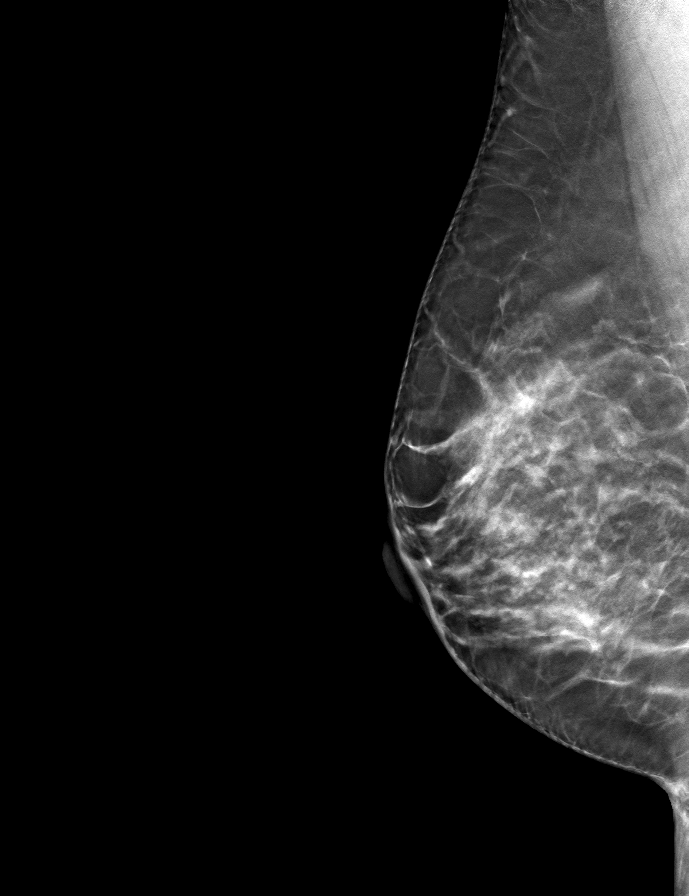

[L MLO tomo · tomo slice 37/73.0]
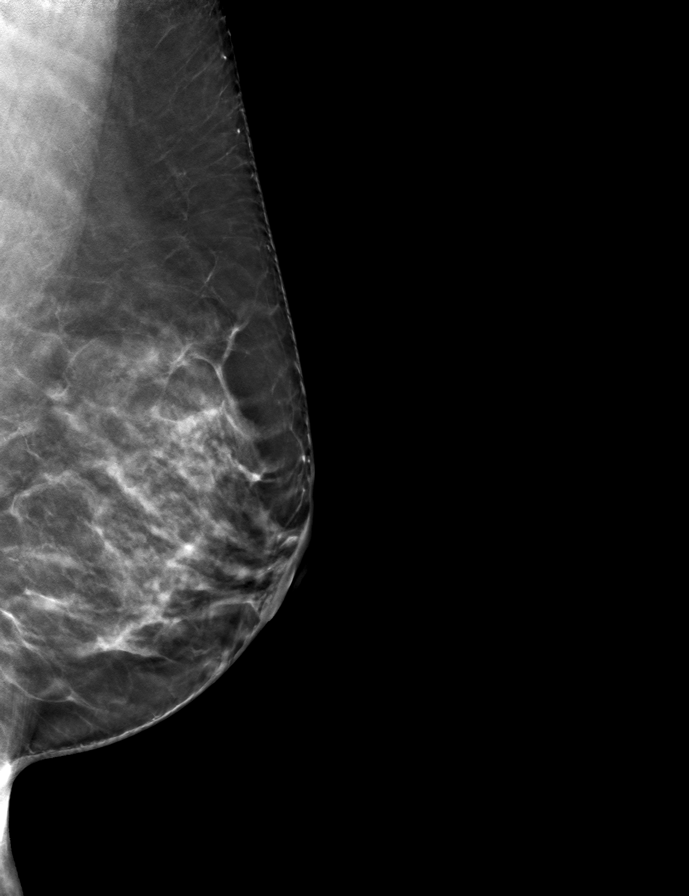

[L CC tomo · tomo slice 37/74.0]
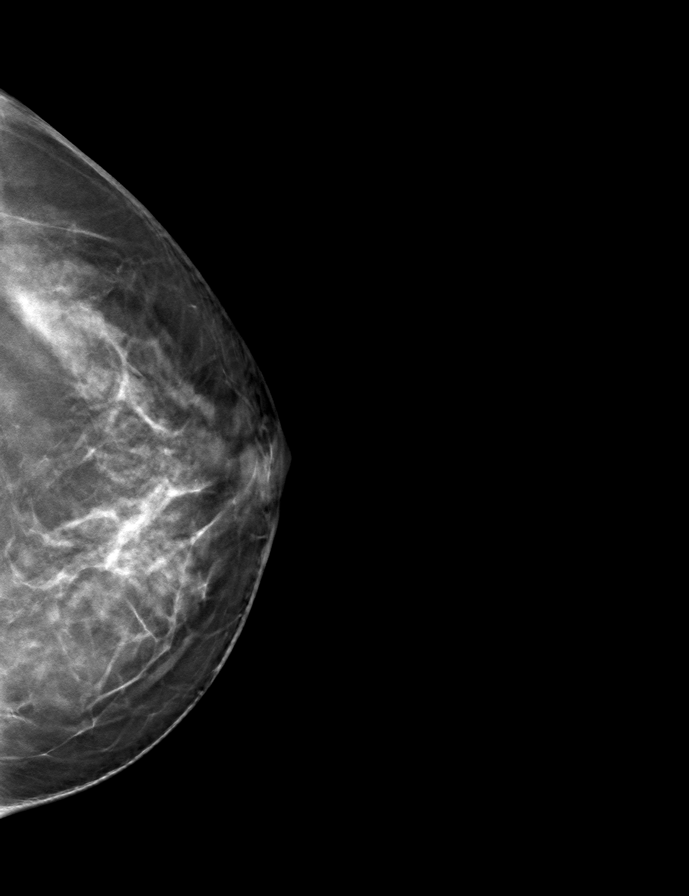

[R CC tomo · tomo slice 37/73.0]
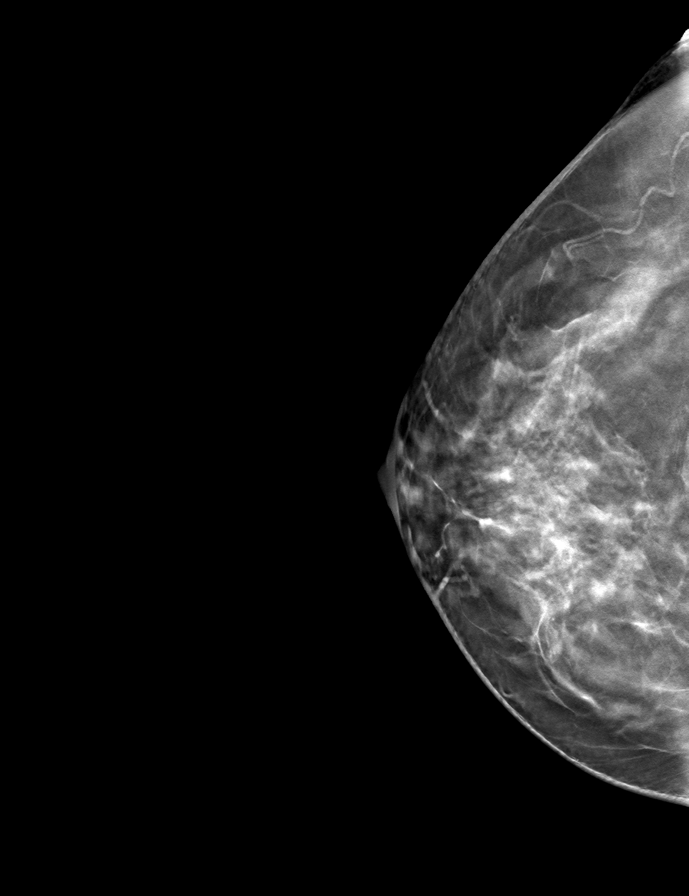

[9 of 24 positions shown; findings below may reference images not displayed]

ACR Breast Density Category c: The breast tissue is heterogeneously
dense, which may obscure small masses.
FINDINGS: There are no findings suspicious for malignancy. Images were
processed with CAD.
IMPRESSION: No mammographic evidence of malignancy. A result letter of this
screening mammogram will be mailed directly to the patient.

RECOMMENDATION:
Screening mammogram in one year. (Code:[id number])

BI-RADS CATEGORY  1: Negative.

## 2022-07-08 ENCOUNTER — Other Ambulatory Visit: Payer: Self-pay | Admitting: Obstetrics & Gynecology

## 2022-07-08 DIAGNOSIS — Z1231 Encounter for screening mammogram for malignant neoplasm of breast: Secondary | ICD-10-CM

## 2022-07-21 ENCOUNTER — Ambulatory Visit
Admission: RE | Admit: 2022-07-21 | Discharge: 2022-07-21 | Disposition: A | Discharge status: Home / Self Care | Payer: No Typology Code available for payment source | Source: Ambulatory Visit | Attending: Obstetrics & Gynecology | Admitting: Obstetrics & Gynecology

## 2022-07-21 DIAGNOSIS — Z1231 Encounter for screening mammogram for malignant neoplasm of breast: Secondary | ICD-10-CM
